# Patient Record
Sex: Male | Born: 1960 | Race: Black or African American | Hispanic: No | Marital: Married | Smoking: Current every day smoker
Health system: Southern US, Community
[De-identification: ages and names within clinical notes are randomized; demographics above are authoritative.]

## PROBLEM LIST (undated history)

## (undated) HISTORY — PX: KNEE SURGERY: SHX244

---

## 1998-02-05 ENCOUNTER — Emergency Department (HOSPITAL_COMMUNITY): Admission: EM | Admit: 1998-02-05 | Discharge: 1998-02-05 | Payer: Self-pay | Admitting: Emergency Medicine

## 1999-08-26 ENCOUNTER — Emergency Department (HOSPITAL_COMMUNITY): Admission: EM | Admit: 1999-08-26 | Discharge: 1999-08-26 | Payer: Self-pay | Admitting: Emergency Medicine

## 1999-08-26 ENCOUNTER — Encounter: Payer: Self-pay | Admitting: Emergency Medicine

## 2002-06-17 ENCOUNTER — Emergency Department (HOSPITAL_COMMUNITY): Admission: EM | Admit: 2002-06-17 | Discharge: 2002-06-17 | Payer: Self-pay | Admitting: Emergency Medicine

## 2002-06-17 ENCOUNTER — Encounter: Payer: Self-pay | Admitting: Emergency Medicine

## 2004-10-27 ENCOUNTER — Emergency Department (HOSPITAL_COMMUNITY): Admission: EM | Admit: 2004-10-27 | Discharge: 2004-10-27 | Payer: Self-pay | Admitting: *Deleted

## 2004-11-05 ENCOUNTER — Emergency Department (HOSPITAL_COMMUNITY): Admission: EM | Admit: 2004-11-05 | Discharge: 2004-11-05 | Payer: Self-pay | Admitting: Emergency Medicine

## 2006-02-28 ENCOUNTER — Emergency Department (HOSPITAL_COMMUNITY): Admission: EM | Admit: 2006-02-28 | Discharge: 2006-02-28 | Payer: Self-pay | Admitting: Emergency Medicine

## 2006-07-12 ENCOUNTER — Emergency Department (HOSPITAL_COMMUNITY): Admission: EM | Admit: 2006-07-12 | Discharge: 2006-07-12 | Payer: Self-pay | Admitting: Emergency Medicine

## 2009-10-08 ENCOUNTER — Emergency Department (HOSPITAL_COMMUNITY): Admission: EM | Admit: 2009-10-08 | Discharge: 2009-10-08 | Payer: Self-pay | Admitting: Emergency Medicine

## 2010-08-20 LAB — POCT I-STAT, CHEM 8
BUN: 11 mg/dL (ref 6–23)
Calcium, Ion: 1.17 mmol/L (ref 1.12–1.32)
Chloride: 108 meq/L (ref 96–112)
Creatinine, Ser: 0.8 mg/dL (ref 0.4–1.5)
Glucose, Bld: 105 mg/dL — ABNORMAL HIGH (ref 70–99)
HCT: 44 % (ref 39.0–52.0)
Hemoglobin: 15 g/dL (ref 13.0–17.0)
Potassium: 4 meq/L (ref 3.5–5.1)
Sodium: 144 meq/L (ref 135–145)
TCO2: 25 mmol/L (ref 0–100)

## 2010-08-20 LAB — POCT CARDIAC MARKERS: CKMB, poc: 3.4 ng/mL (ref 1.0–8.0)

## 2010-11-18 ENCOUNTER — Ambulatory Visit
Admission: RE | Admit: 2010-11-18 | Discharge: 2010-11-18 | Disposition: A | Discharge status: Home / Self Care | Payer: No Typology Code available for payment source | Source: Ambulatory Visit | Attending: Infectious Diseases | Admitting: Infectious Diseases

## 2010-11-18 ENCOUNTER — Other Ambulatory Visit: Payer: Self-pay | Admitting: Infectious Diseases

## 2010-11-18 DIAGNOSIS — R7611 Nonspecific reaction to tuberculin skin test without active tuberculosis: Secondary | ICD-10-CM

## 2011-03-29 ENCOUNTER — Emergency Department (HOSPITAL_COMMUNITY): Payer: Self-pay

## 2011-03-29 ENCOUNTER — Emergency Department (HOSPITAL_COMMUNITY)
Admission: EM | Admit: 2011-03-29 | Discharge: 2011-03-29 | Disposition: A | Discharge status: Home / Self Care | Payer: Self-pay | Attending: Emergency Medicine | Admitting: Emergency Medicine

## 2011-03-29 DIAGNOSIS — R002 Palpitations: Secondary | ICD-10-CM | POA: Insufficient documentation

## 2011-03-29 LAB — CBC
HCT: 41.7 % (ref 39.0–52.0)
Hemoglobin: 14.6 g/dL (ref 13.0–17.0)
MCHC: 35 g/dL (ref 30.0–36.0)
RBC: 4.65 MIL/uL (ref 4.22–5.81)
WBC: 7.6 10*3/uL (ref 4.0–10.5)

## 2011-03-29 LAB — BASIC METABOLIC PANEL
CO2: 26 mEq/L (ref 19–32)
Chloride: 107 mEq/L (ref 96–112)
Potassium: 3.9 mEq/L (ref 3.5–5.1)
Sodium: 141 mEq/L (ref 135–145)

## 2011-03-29 LAB — DIFFERENTIAL
Basophils Absolute: 0 10*3/uL (ref 0.0–0.1)
Lymphocytes Relative: 19 % (ref 12–46)
Monocytes Absolute: 0.7 10*3/uL (ref 0.1–1.0)
Neutro Abs: 5.3 10*3/uL (ref 1.7–7.7)

## 2011-03-29 LAB — POCT I-STAT TROPONIN I

## 2012-11-29 ENCOUNTER — Encounter (HOSPITAL_COMMUNITY): Payer: Self-pay | Admitting: *Deleted

## 2012-11-29 ENCOUNTER — Emergency Department (INDEPENDENT_AMBULATORY_CARE_PROVIDER_SITE_OTHER)
Admission: EM | Admit: 2012-11-29 | Discharge: 2012-11-29 | Disposition: A | Discharge status: Home / Self Care | Payer: Self-pay | Source: Home / Self Care | Attending: Emergency Medicine | Admitting: Emergency Medicine

## 2012-11-29 DIAGNOSIS — H6120 Impacted cerumen, unspecified ear: Secondary | ICD-10-CM

## 2012-11-29 DIAGNOSIS — H6123 Impacted cerumen, bilateral: Secondary | ICD-10-CM

## 2012-11-29 MED ORDER — DOCUSATE SODIUM 50 MG/5ML PO LIQD
ORAL | Status: AC
  Filled 2012-11-29: qty 10

## 2012-11-29 NOTE — ED Provider Notes (Addendum)
   History    CSN: [id number] Arrival date & time 11/29/12  1107  First MD Initiated Contact with Patient 11/29/12 1120     Chief Complaint  Patient presents with  . Otalgia   (Consider location/radiation/quality/duration/timing/severity/associated sxs/prior Treatment) Patient is a 52 y.o. male presenting with ear pain. The history is provided by the patient. No language interpreter was used.  Otalgia Location:  Bilateral Behind ear:  No abnormality Quality:  Pressure Severity:  No pain Onset quality:  Gradual Duration:  4 weeks Timing:  Constant Progression:  Unchanged Chronicity:  New Relieved by:  Nothing Worsened by:  Nothing tried Ineffective treatments:  None tried  History reviewed. No pertinent past medical history. History reviewed. No pertinent past surgical history. History reviewed. No pertinent family history. History  Substance Use Topics  . Smoking status: Current Every Day Smoker  . Smokeless tobacco: Not on file  . Alcohol Use: Yes    Review of Systems  Constitutional: Negative.   HENT: Positive for ear pain.   Eyes: Negative.   Respiratory: Negative.   Cardiovascular: Negative.   Gastrointestinal: Negative.   Endocrine: Negative.   Genitourinary: Negative.   Musculoskeletal: Negative.   Neurological: Negative.   Hematological: Negative.   Psychiatric/Behavioral: Negative.   All other systems reviewed and are negative.    Allergies  Review of patient's allergies indicates no known allergies.  Home Medications  No current outpatient prescriptions on file. BP 120/70  Pulse 72  Temp(Src) 98.6 F (37 C) (Oral)  Resp 16  SpO2 100% Physical Exam  Nursing note and vitals reviewed. Constitutional: He is oriented to person, place, and time. He appears well-developed and well-nourished.  HENT:  Head: Normocephalic and atraumatic.  Mouth/Throat: Oropharynx is clear and moist.  IMPACTED CERUMEN IN BOTH EARS  Eyes: Conjunctivae are normal.  Pupils are equal, round, and reactive to light.  Neck: Normal range of motion. Neck supple.  Cardiovascular: Normal rate, regular rhythm, normal heart sounds and intact distal pulses.   No murmur heard. Pulmonary/Chest: Effort normal and breath sounds normal.  Abdominal: Soft. Bowel sounds are normal. He exhibits no distension and no mass. There is no tenderness.  Musculoskeletal: Normal range of motion.  Neurological: He is alert and oriented to person, place, and time. No cranial nerve deficit. He exhibits normal muscle tone. Coordination normal.  Skin: Skin is warm and dry.  Psychiatric: He has a normal mood and affect.    ED Course  Procedures (including critical care time) Labs Reviewed - No data to display No results found. 1. Impacted cerumen, bilateral     MDM  BOTH EARS IRRIGATED AND CLEARED OF CERUMEN IMPACTION  Duwayne Heck de Marcello Moores, MD 11/29/12 [address] [place], [place] 11/29/12 [phone]

## 2012-11-29 NOTE — ED Notes (Signed)
Pt  Has  A  Muffled    Sensation   In  Both  Ears  With       Sensation of  Ringing as  Well      Denys  Any pain   Denys  Any  Loss  Of balance

## 2015-07-18 ENCOUNTER — Ambulatory Visit: Payer: Self-pay

## 2016-05-12 ENCOUNTER — Encounter: Payer: Self-pay | Admitting: Internal Medicine

## 2016-06-16 ENCOUNTER — Ambulatory Visit (AMBULATORY_SURGERY_CENTER): Payer: Self-pay

## 2016-06-16 DIAGNOSIS — Z1211 Encounter for screening for malignant neoplasm of colon: Secondary | ICD-10-CM

## 2016-06-16 MED ORDER — SUPREP BOWEL PREP KIT 17.5-3.13-1.6 GM/177ML PO SOLN: 1.0000 | Freq: Once | ORAL | 0 refills | Status: AC

## 2016-06-16 NOTE — Progress Notes (Signed)
No allergies to eggs or soy No past problem with anesthesia No home oxygen No diet meds  Registered emmi

## 2016-06-18 ENCOUNTER — Encounter: Payer: Self-pay | Admitting: Internal Medicine

## 2016-06-30 ENCOUNTER — Encounter: Payer: Self-pay | Admitting: Internal Medicine

## 2016-06-30 ENCOUNTER — Ambulatory Visit (AMBULATORY_SURGERY_CENTER): Payer: BLUE CROSS/BLUE SHIELD | Admitting: Internal Medicine

## 2016-06-30 VITALS — BP 109/73 | HR 81 | Temp 97.5°F | Resp 13 | Ht 65.0 in | Wt 145.0 lb

## 2016-06-30 DIAGNOSIS — D123 Benign neoplasm of transverse colon: Secondary | ICD-10-CM

## 2016-06-30 DIAGNOSIS — D126 Benign neoplasm of colon, unspecified: Secondary | ICD-10-CM

## 2016-06-30 DIAGNOSIS — D125 Benign neoplasm of sigmoid colon: Secondary | ICD-10-CM | POA: Diagnosis not present

## 2016-06-30 DIAGNOSIS — Z1211 Encounter for screening for malignant neoplasm of colon: Secondary | ICD-10-CM

## 2016-06-30 DIAGNOSIS — D124 Benign neoplasm of descending colon: Secondary | ICD-10-CM | POA: Diagnosis not present

## 2016-06-30 DIAGNOSIS — Z1212 Encounter for screening for malignant neoplasm of rectum: Secondary | ICD-10-CM | POA: Diagnosis not present

## 2016-06-30 DIAGNOSIS — K635 Polyp of colon: Secondary | ICD-10-CM

## 2016-06-30 MED ORDER — SODIUM CHLORIDE 0.9 % IV SOLN: 500.0000 mL | INTRAVENOUS | Status: AC

## 2016-06-30 NOTE — Progress Notes (Signed)
Called to room to assist during endoscopic procedure.  Patient ID and intended procedure confirmed with present staff. Received instructions for my participation in the procedure from the performing physician.  

## 2016-06-30 NOTE — Progress Notes (Signed)
No problems noted in the recovery room. maw 

## 2016-06-30 NOTE — Op Note (Signed)
[place] Patient Name: Terrence Williams Procedure Date: 06/30/2016 2:46 PM MRN: [id number] Endoscopist: Jerene Bears , MD Age: 56 Referring MD:  Date of Birth: 11-24-1960 Gender: Male Account #: [id number] Procedure:                Colonoscopy Indications:              Screening for colorectal malignant neoplasm, This                            is the patient's first colonoscopy Medicines:                Monitored Anesthesia Care Procedure:                Pre-Anesthesia Assessment:                           - Prior to the procedure, a History and Physical                            was performed, and patient medications and                            allergies were reviewed. The patient's tolerance of                            previous anesthesia was also reviewed. The risks                            and benefits of the procedure and the sedation                            options and risks were discussed with the patient.                            All questions were answered, and informed consent                            was obtained. Prior Anticoagulants: The patient has                            taken no previous anticoagulant or antiplatelet                            agents. ASA Grade Assessment: II - A patient with                            mild systemic disease. After reviewing the risks                            and benefits, the patient was deemed in                            satisfactory condition to undergo the procedure.  After obtaining informed consent, the colonoscope                            was passed under direct vision. Throughout the                            procedure, the patient's blood pressure, pulse, and                            oxygen saturations were monitored continuously. The                            Model CF-HQ190L [id number]) scope was introduced                            through the anus and advanced  to the the cecum,                            identified by appendiceal orifice and ileocecal                            valve. The colonoscopy was performed without                            difficulty. The patient tolerated the procedure                            well. The quality of the bowel preparation was                            good. The ileocecal valve, appendiceal orifice, and                            rectum were photographed. Scope In: 2:59:12 PM Scope Out: 3:15:26 PM Scope Withdrawal Time: 0 hours 13 minutes 6 seconds  Total Procedure Duration: 0 hours 16 minutes 14 seconds  Findings:                 The perianal and digital rectal examinations were                            normal.                           A 3 mm polyp was found in the hepatic flexure. The                            polyp was sessile. The polyp was removed with a                            cold biopsy forceps. Resection and retrieval were                            complete.  A 5 mm polyp was found in the descending colon. The                            polyp was sessile. The polyp was removed with a                            cold snare. Resection and retrieval were complete.                           Two sessile polyps were found in the sigmoid colon.                            The polyps were 4 to 5 mm in size. These polyps                            were removed with a cold snare. Resection and                            retrieval were complete.                           Multiple diverticula were found in the sigmoid                            colon and descending colon.                           Internal hemorrhoids were found during                            retroflexion. The hemorrhoids were small. Complications:            No immediate complications. Estimated Blood Loss:     Estimated blood loss was minimal. Impression:               - One 3 mm polyp at the hepatic  flexure, removed                            with a cold biopsy forceps. Resected and retrieved.                           - One 5 mm polyp in the descending colon, removed                            with a cold snare. Resected and retrieved.                           - Two 4 to 5 mm polyps in the sigmoid colon,                            removed with a cold snare. Resected and retrieved.                           -  Mild diverticulosis in the sigmoid colon and in                            the descending colon.                           - Internal hemorrhoids. Recommendation:           - Patient has a contact number available for                            emergencies. The signs and symptoms of potential                            delayed complications were discussed with the                            patient. Return to normal activities tomorrow.                            Written discharge instructions were provided to the                            patient.                           - Resume previous diet.                           - Continue present medications.                           - Await pathology results.                           - Repeat colonoscopy is recommended. The                            colonoscopy date will be determined after pathology                            results from today's exam become available for                            review. Jerene Bears, MD 06/30/2016 3:18:20 PM This report has been signed electronically.

## 2016-06-30 NOTE — Progress Notes (Signed)
Spontaneous respirations throughout. VSS. Resting comfortably. To PACU on room air. Report to  Annette RN.  

## 2016-06-30 NOTE — Patient Instructions (Signed)
YOU HAD AN ENDOSCOPIC PROCEDURE TODAY AT [facility]:   Refer to the procedure report that was given to you for any specific questions about what was found during the examination.  If the procedure report does not answer your questions, please call your gastroenterologist to clarify.  If you requested that your care partner not be given the details of your procedure findings, then the procedure report has been included in a sealed envelope for you to review at your convenience later.  YOU SHOULD EXPECT: Some feelings of bloating in the abdomen. Passage of more gas than usual.  Walking can help get rid of the air that was put into your GI tract during the procedure and reduce the bloating. If you had a lower endoscopy (such as a colonoscopy or flexible sigmoidoscopy) you may notice spotting of blood in your stool or on the toilet paper. If you underwent a bowel prep for your procedure, you may not have a normal bowel movement for a few days.  Please Note:  You might notice some irritation and congestion in your nose or some drainage.  This is from the oxygen used during your procedure.  There is no need for concern and it should clear up in a day or so.  SYMPTOMS TO REPORT IMMEDIATELY:   Following lower endoscopy (colonoscopy or flexible sigmoidoscopy):  Excessive amounts of blood in the stool  Significant tenderness or worsening of abdominal pains  Swelling of the abdomen that is new, acute  Fever of 100F or higher   Following upper endoscopy (EGD)  Vomiting of blood or coffee ground material  New chest pain or pain under the shoulder blades  Painful or persistently difficult swallowing  New shortness of breath  Fever of 100F or higher  Black, tarry-looking stools  For urgent or emergent issues, a gastroenterologist can be reached at any hour by calling [phone].   DIET:  We do recommend a small meal at first, but then you may proceed to your regular diet.  Drink  plenty of fluids but you should avoid alcoholic beverages for 24 hours.  ACTIVITY:  You should plan to take it easy for the rest of today and you should NOT DRIVE or use heavy machinery until tomorrow (because of the sedation medicines used during the test).    FOLLOW UP: Our staff will call the number listed on your records the next business day following your procedure to check on you and address any questions or concerns that you may have regarding the information given to you following your procedure. If we do not reach you, we will leave a message.  However, if you are feeling well and you are not experiencing any problems, there is no need to return our call.  We will assume that you have returned to your regular daily activities without incident.  If any biopsies were taken you will be contacted by phone or by letter within the next 1-3 weeks.  Please call us at [phone] if you have not heard about the biopsies in 3 weeks.    SIGNATURES/CONFIDENTIALITY: You and/or your care partner have signed paperwork which will be entered into your electronic medical record.  These signatures attest to the fact that that the information above on your After Visit Summary has been reviewed and is understood.  Full responsibility of the confidentiality of this discharge information lies with you and/or your care-partner.    Handouts were given to your care partner on polyps,  hemorrhoids, and a high fiber diet with liberal fluid intake. You may resume your current medications today. Await biopsy results. Please call if any questions or concerns.   

## 2016-07-01 ENCOUNTER — Telehealth: Payer: Self-pay

## 2016-07-01 ENCOUNTER — Telehealth: Payer: Self-pay | Admitting: *Deleted

## 2016-07-01 NOTE — Telephone Encounter (Signed)
  Follow up Call-  Call back number 06/30/2016  Post procedure Call Back phone  # [phone]  Permission to leave phone message Yes  Some recent data might be hidden     Patient has tolerated food without any problems. No was checked in error.   Patient questions:  Do you have a fever, pain , or abdominal swelling? No. Pain Score  0 *  Have you tolerated food without any problems? No.  Have you been able to return to your normal activities? Yes.    Do you have any questions about your discharge instructions: Diet   No. Medications  No. Follow up visit  No.  Do you have questions or concerns about your Care? No.  Actions: * If pain score is 4 or above: No action needed, pain <4.

## 2016-07-01 NOTE — Telephone Encounter (Signed)
No answer. Left message to call if questions or concerns. 

## 2016-07-08 ENCOUNTER — Encounter: Payer: Self-pay | Admitting: Internal Medicine

## 2017-01-06 ENCOUNTER — Other Ambulatory Visit: Payer: Self-pay | Admitting: Family Medicine

## 2017-01-06 ENCOUNTER — Ambulatory Visit
Admission: RE | Admit: 2017-01-06 | Discharge: 2017-01-06 | Disposition: A | Discharge status: Home / Self Care | Payer: BLUE CROSS/BLUE SHIELD | Source: Ambulatory Visit | Attending: Family Medicine | Admitting: Family Medicine

## 2017-01-06 DIAGNOSIS — M542 Cervicalgia: Secondary | ICD-10-CM
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
## 2019-01-03 ENCOUNTER — Other Ambulatory Visit: Payer: Self-pay

## 2019-01-03 DIAGNOSIS — Z20822 Contact with and (suspected) exposure to covid-19: Secondary | ICD-10-CM

## 2019-01-04 LAB — NOVEL CORONAVIRUS, NAA: SARS-CoV-2, NAA: NOT DETECTED

## 2020-09-28 ENCOUNTER — Emergency Department (HOSPITAL_COMMUNITY): Payer: No Typology Code available for payment source

## 2020-09-28 ENCOUNTER — Emergency Department (HOSPITAL_COMMUNITY)
Admission: EM | Admit: 2020-09-28 | Discharge: 2020-09-28 | Disposition: A | Discharge status: Home / Self Care | Payer: No Typology Code available for payment source | Attending: Emergency Medicine | Admitting: Emergency Medicine

## 2020-09-28 ENCOUNTER — Other Ambulatory Visit: Payer: Self-pay

## 2020-09-28 ENCOUNTER — Encounter (HOSPITAL_COMMUNITY): Payer: Self-pay

## 2020-09-28 DIAGNOSIS — F17200 Nicotine dependence, unspecified, uncomplicated: Secondary | ICD-10-CM | POA: Insufficient documentation

## 2020-09-28 DIAGNOSIS — W230XXA Caught, crushed, jammed, or pinched between moving objects, initial encounter: Secondary | ICD-10-CM | POA: Insufficient documentation

## 2020-09-28 DIAGNOSIS — Z23 Encounter for immunization: Secondary | ICD-10-CM | POA: Diagnosis not present

## 2020-09-28 DIAGNOSIS — S62665B Nondisplaced fracture of distal phalanx of left ring finger, initial encounter for open fracture: Secondary | ICD-10-CM

## 2020-09-28 DIAGNOSIS — S62665A Nondisplaced fracture of distal phalanx of left ring finger, initial encounter for closed fracture: Secondary | ICD-10-CM | POA: Diagnosis not present

## 2020-09-28 DIAGNOSIS — Y990 Civilian activity done for income or pay: Secondary | ICD-10-CM | POA: Insufficient documentation

## 2020-09-28 DIAGNOSIS — S6992XA Unspecified injury of left wrist, hand and finger(s), initial encounter: Secondary | ICD-10-CM | POA: Diagnosis present

## 2020-09-28 DIAGNOSIS — S61325A Laceration with foreign body of left ring finger with damage to nail, initial encounter: Secondary | ICD-10-CM

## 2020-09-28 MED ORDER — CEPHALEXIN 500 MG PO CAPS: 500.0000 mg | ORAL_CAPSULE | Freq: Four times a day (QID) | ORAL | 0 refills | Status: AC

## 2020-09-28 MED ORDER — CEFAZOLIN SODIUM-DEXTROSE 2-4 GM/100ML-% IV SOLN
2.0000 g | Freq: Once | INTRAVENOUS | Status: AC
  Administered 2020-09-28: 2 g via INTRAVENOUS
  Filled 2020-09-28: qty 100

## 2020-09-28 MED ORDER — OXYCODONE-ACETAMINOPHEN 5-325 MG PO TABS: 1.0000 | ORAL_TABLET | Freq: Three times a day (TID) | ORAL | 0 refills | Status: AC | PRN

## 2020-09-28 MED ORDER — OXYCODONE-ACETAMINOPHEN 5-325 MG PO TABS
1.0000 | ORAL_TABLET | Freq: Once | ORAL | Status: AC
  Administered 2020-09-28: 1 via ORAL
  Filled 2020-09-28: qty 1

## 2020-09-28 MED ORDER — TETANUS-DIPHTH-ACELL PERTUSSIS 5-2.5-18.5 LF-MCG/0.5 IM SUSY
0.5000 mL | PREFILLED_SYRINGE | Freq: Once | INTRAMUSCULAR | Status: AC
  Administered 2020-09-28: 0.5 mL via INTRAMUSCULAR
  Filled 2020-09-28: qty 0.5

## 2020-09-28 MED ORDER — LIDOCAINE HCL (PF) 1 % IJ SOLN
5.0000 mL | Freq: Once | INTRAMUSCULAR | Status: AC
  Administered 2020-09-28: 5 mL
  Filled 2020-09-28: qty 30

## 2020-09-28 NOTE — ED Notes (Signed)
Wound dressed and splint applied.

## 2020-09-28 NOTE — Discharge Instructions (Signed)
You have a fracture of your fourth finger as well as a laceration which received 4 sutures.  Please abstain from getting the wound wet for the first 24 hours, after that you may remove the hard splint only to rinse out the wound twice a day, please keep splint on at all times.  I have started you on antibiotic please take as prescribed.  For pain I have written you a prescription for narcotics, please take as needed for pain, please beware this medication can make you drowsy do not consume alcohol or operate heavy machinery while taking this medication.  This medication has Tylenol in it do not consume Tylenol while taking this medication.  You will follow-up with Dr. Grandville Silos of hand surgery, his office will call you, if not hear from him by Tuesday please call to schedule an appointment.  Come back to the emergency department if you develop chest pain, shortness of breath, severe abdominal pain, uncontrolled nausea, vomiting, diarrhea.

## 2020-09-28 NOTE — ED Triage Notes (Signed)
Pt slammed his finger in a metal door today. Pt reports going to UC and they confirmed a broken left ring finger. Pt sent here for possible lac repair.
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
## 2020-09-28 NOTE — ED Triage Notes (Signed)
Emergency Medicine Provider Triage Evaluation Note  Terrence Williams , a 60 y.o. male  was evaluated in triage.  Pt complains of slammed his fingers in a door today, has left ring finger pain, was bleeding now under control denies paresthesia or weakness.  Review of Systems  Positive: Left ring finger pain, bleeding Negative: Denies paresthesias or weakness  Physical Exam  BP (!) 141/93 (BP Location: Right Arm)   Pulse 73   Temp 98.4 F (36.9 C) (Oral)   Resp 16   SpO2 99%  Gen:   Awake, no distress  HEENT:  Atraumatic  Resp:  Normal effort  Cardiac:  Normal rate  MSK:   Moves extremities without difficulty  Neuro:  Speech clear   Medical Decision Making  Medically screening exam initiated at 5:26 PM.  Appropriate orders placed.  Terrence Williams was informed that the remainder of the evaluation will be completed by another provider, this initial triage assessment does not replace that evaluation, and the importance of remaining in the ED until their evaluation is complete.  Clinical Impression  Patient has left ring finger pain, imaging has been ordered, patient need further work-up in the emergency department.   Marcello Fennel, PA-C 09/28/20 1727

## 2020-11-13 ENCOUNTER — Other Ambulatory Visit: Payer: Self-pay | Admitting: Family Medicine

## 2020-11-13 DIAGNOSIS — M5442 Lumbago with sciatica, left side: Secondary | ICD-10-CM

## 2020-12-06 ENCOUNTER — Other Ambulatory Visit: Payer: Self-pay

## 2023-12-18 ENCOUNTER — Encounter: Payer: Self-pay | Admitting: Advanced Practice Midwife
